# Patient Record
Sex: Female | Born: 1945 | Race: Black or African American | Hispanic: No
Health system: Southern US, Community
[De-identification: ages and names within clinical notes are randomized; demographics above are authoritative.]

---

## 2015-07-15 ENCOUNTER — Inpatient Hospital Stay
Admission: AD | Admit: 2015-07-15 | Discharge: 2015-08-07 | Disposition: E | Payer: Self-pay | Source: Ambulatory Visit | Attending: Internal Medicine | Admitting: Internal Medicine

## 2015-07-15 DIAGNOSIS — Z4659 Encounter for fitting and adjustment of other gastrointestinal appliance and device: Secondary | ICD-10-CM

## 2015-07-15 DIAGNOSIS — I469 Cardiac arrest, cause unspecified: Secondary | ICD-10-CM

## 2015-07-15 DIAGNOSIS — J9690 Respiratory failure, unspecified, unspecified whether with hypoxia or hypercapnia: Secondary | ICD-10-CM

## 2015-07-15 LAB — BLOOD GAS, ARTERIAL
ACID-BASE DEFICIT: 5.9 mmol/L — AB (ref 0.0–2.0)
Bicarbonate: 18.7 mEq/L — ABNORMAL LOW (ref 20.0–24.0)
FIO2: 0.3
MECHVT: 450 mL
O2 SAT: 95.4 %
PEEP/CPAP: 5 cmH2O
PH ART: 7.348 — AB (ref 7.350–7.450)
PO2 ART: 84.1 mmHg (ref 80.0–100.0)
Patient temperature: 98.6
RATE: 16 resp/min
TCO2: 19.8 mmol/L (ref 0–100)
pCO2 arterial: 35 mmHg (ref 35.0–45.0)

## 2015-07-16 ENCOUNTER — Other Ambulatory Visit (HOSPITAL_COMMUNITY): Payer: Self-pay

## 2015-07-16 DIAGNOSIS — J9621 Acute and chronic respiratory failure with hypoxia: Secondary | ICD-10-CM

## 2015-07-16 DIAGNOSIS — Z930 Tracheostomy status: Secondary | ICD-10-CM

## 2015-07-16 LAB — COMPREHENSIVE METABOLIC PANEL
ALBUMIN: 2.6 g/dL — AB (ref 3.5–5.0)
ALT: 34 U/L (ref 14–54)
AST: 66 U/L — AB (ref 15–41)
Alkaline Phosphatase: 51 U/L (ref 38–126)
Anion gap: 15 (ref 5–15)
BUN: 72 mg/dL — AB (ref 6–20)
CALCIUM: 8.2 mg/dL — AB (ref 8.9–10.3)
CO2: 16 mmol/L — ABNORMAL LOW (ref 22–32)
CREATININE: 3.99 mg/dL — AB (ref 0.44–1.00)
Chloride: 101 mmol/L (ref 101–111)
GFR calc Af Amer: 12 mL/min — ABNORMAL LOW (ref >60)
GFR, EST NON AFRICAN AMERICAN: 11 mL/min — AB (ref >60)
GLUCOSE: 100 mg/dL — AB (ref 65–99)
Potassium: 4.1 mmol/L (ref 3.5–5.1)
Sodium: 132 mmol/L — ABNORMAL LOW (ref 135–145)
TOTAL PROTEIN: 6.7 g/dL (ref 6.5–8.1)
Total Bilirubin: 2.1 mg/dL — ABNORMAL HIGH (ref 0.3–1.2)

## 2015-07-16 LAB — CBC
HCT: 27.9 % — ABNORMAL LOW (ref 36.0–46.0)
Hemoglobin: 8.2 g/dL — ABNORMAL LOW (ref 12.0–15.0)
MCH: 23.5 pg — ABNORMAL LOW (ref 26.0–34.0)
MCHC: 29.4 g/dL — ABNORMAL LOW (ref 30.0–36.0)
MCV: 79.9 fL (ref 78.0–100.0)
PLATELETS: 143 10*3/uL — AB (ref 150–400)
RBC: 3.49 MIL/uL — AB (ref 3.87–5.11)
RDW: 27.9 % — ABNORMAL HIGH (ref 11.5–15.5)
WBC: 4.4 10*3/uL (ref 4.0–10.5)

## 2015-07-16 LAB — TSH: TSH: 1.409 u[IU]/mL (ref 0.350–4.500)

## 2015-07-16 NOTE — Consult Note (Signed)
   Name: Sheila Montgomery MRN: [id number] DOB: 1945/12/16    ADMISSION DATE:  07/19/2015 CONSULTATION DATE:  07/16/2015  REFERRING MD :  Sharyon MedicusHijazi  CHIEF COMPLAINT:  Ventilator weaning  HISTORY OF PRESENT ILLNESS:  70 year old woman with CVA and ischemic cardiomyopathy, nursing home resident was admitted to an outside hospital with altered mental status attributed to hepatic encephalopathy. Hospital course was complicated by acute hypercarbic respiratory failure requiring prolonged mechanical ventilation and tracheostomy, C. difficile colitis and VRE UTI. She required transient dialysis. On transfer she is being treated with Zyvox and being diuresed with Bumex. She is on lactulose and cephalexin for encephalopathy  SIGNIFICANT EVENTS    STUDIES:  Chest x-ray 3/10 alveolar interstitial edema     PAST MEDICAL HISTORY : CVA and ischemic cardiomyopathy,  Prior to Admission medications   Not on File   Allergies not on file  FAMILY HISTORY:  No family history of CAD   SOCIAL HISTORY: Nursing home resident    REVIEW OF SYSTEMS:  Unable to obtain, since she is unresponsive on mechanical ventilation   SUBJECTIVE:   VITAL SIGNS:    PHYSICAL EXAMINATION: Gen. Chronically ill-appearing, in no distress, normal affect ENT - no lesions, no post nasal drip Neck: No JVD, no thyromegaly, no carotid bruits, tracheostomy Lungs: no use of accessory muscles, no dullness to percussion, decreased without rales or rhonchi  Cardiovascular: Rhythm regular, heart sounds  normal, no murmurs, no peripheral edema Abdomen: soft and non-tender, no hepatosplenomegaly, BS normal. Musculoskeletal: No deformities, no cyanosis or clubbing Neuro:  Awake, eyes open, but does not follow commands, does maintain eye contact, moves all 4 extremities Skin:  Warm, no lesions/ rash    Recent Labs Lab 07/16/15 0600  NA 132*  K 4.1  CL 101  CO2 16*  BUN 72*  CREATININE 3.99*  GLUCOSE 100*    Recent  Labs Lab 07/16/15 0600  HGB 8.2*  HCT 27.9*  WBC 4.4  PLT 143*   Dg Chest Port 1 View  07/16/2015  CLINICAL DATA:  Respiratory failure, ischemic cardiomyopathy EXAM: PORTABLE CHEST 1 VIEW COMPARISON:  None in PACs FINDINGS: The lungs are adequately inflated. The cardiac silhouette is enlarged. The pulmonary vascularity is engorged. The left hemidiaphragm is obscured. The tracheostomy appliance tip projects at the level of the clavicular heads. The right internal jugular venous catheter tip projects over the midportion of the SVC. IMPRESSION: CHF with pulmonary interstitial and early alveolar edema. Probable small left pleural effusion. Electronically Signed   By: David  SwazilandJordan M.D.   On: 07/16/2015 07:33    ASSESSMENT / PLAN:  Acute on chronic respiratory failure/tracheostomy  -We'll start weaning on pressure support per facility protocol  Acute encephalopathy- continue lactulose and xifaxan  AKI on CKD- bumex being tried VRE UTI / C diff colitis- per select MD  Discussed on multidisciplinary rounds with select team   Oretha MilchALVA,RAKESH V. MD  07/16/2015, 4:39 PM

## 2015-07-17 ENCOUNTER — Other Ambulatory Visit (HOSPITAL_COMMUNITY): Payer: Self-pay

## 2015-07-17 LAB — CBC
HEMATOCRIT: 33.3 % — AB (ref 36.0–46.0)
HEMOGLOBIN: 9 g/dL — AB (ref 12.0–15.0)
MCH: 24.2 pg — AB (ref 26.0–34.0)
MCHC: 27 g/dL — AB (ref 30.0–36.0)
MCV: 89.5 fL (ref 78.0–100.0)
Platelets: 144 10*3/uL — ABNORMAL LOW (ref 150–400)
RBC: 3.72 MIL/uL — AB (ref 3.87–5.11)
RDW: 30.6 % — ABNORMAL HIGH (ref 11.5–15.5)
WBC: 15.7 10*3/uL — ABNORMAL HIGH (ref 4.0–10.5)

## 2015-07-17 LAB — BASIC METABOLIC PANEL
ANION GAP: 29 — AB (ref 5–15)
BUN: 87 mg/dL — AB (ref 6–20)
BUN: 88 mg/dL — ABNORMAL HIGH (ref 6–20)
CALCIUM: 9.1 mg/dL (ref 8.9–10.3)
CALCIUM: 9.3 mg/dL (ref 8.9–10.3)
CO2: 7 mmol/L — ABNORMAL LOW (ref 22–32)
CREATININE: 4.92 mg/dL — AB (ref 0.44–1.00)
CREATININE: 5.07 mg/dL — AB (ref 0.44–1.00)
Chloride: 93 mmol/L — ABNORMAL LOW (ref 101–111)
Chloride: 93 mmol/L — ABNORMAL LOW (ref 101–111)
GFR calc Af Amer: 10 mL/min — ABNORMAL LOW (ref >60)
GFR calc Af Amer: 9 mL/min — ABNORMAL LOW (ref >60)
GFR calc non Af Amer: 8 mL/min — ABNORMAL LOW (ref >60)
GFR, EST NON AFRICAN AMERICAN: 8 mL/min — AB (ref >60)
GLUCOSE: 71 mg/dL (ref 65–99)
GLUCOSE: 57 mg/dL — AB (ref 65–99)
Potassium: 5.8 mmol/L — ABNORMAL HIGH (ref 3.5–5.1)
Potassium: 6 mmol/L — ABNORMAL HIGH (ref 3.5–5.1)
Sodium: 129 mmol/L — ABNORMAL LOW (ref 135–145)
Sodium: 130 mmol/L — ABNORMAL LOW (ref 135–145)

## 2015-07-17 LAB — TROPONIN I
TROPONIN I: 11.51 ng/mL — AB (ref <0.031)
Troponin I: 10.9 ng/mL (ref <0.031)

## 2015-07-18 MED FILL — Medication: Qty: 1 | Status: AC

## 2015-08-07 NOTE — Code Documentation (Signed)
  Patient Name: Sheila Montgomery   MRN: [id number]   Date of Birth/ Sex: [birth year] , female      Admission Date: 07/28/2015  Attending Provider: Carron CurieAli Hijazi, MD  Primary Diagnosis: <principal problem not specified>   Indication: Pt was in her stable state during this hospitalization on vent, until she coded approximately 1:00pm today 07/20/2015. She underwent approximately 30 minutes resuscitation before ROSC was achieved. She then lost pulse approximately 6:30pm and CPR was initiated. ACLS protocol was underway when code team arrived to this code Blue. ROSC was achieved within approximately 10 minutes and patient stabilized with adequate pulse pressures. However approximately 8 minutes later she became pulseless again and CPR was resumed. Critical care physician Dr. Craige CottaSood and family were contacted and discussed plan at bedside. Patient was changed to DNR status during this code process after discussion with her sister at bedside. She once again achieved ROSC and decision was made accordingly to continue ventilator support.  Technical Description:  - CPR performance duration:  30 minutes  - Was defibrillation or cardioversion used? No   - Was external pacer placed? No  - Was patient intubated pre/post CPR? Pre CPR   Medications Administered: Y = Yes; Blank = No Amiodarone    Atropine    Calcium    Epinephrine  Y  Lidocaine    Magnesium    Norepinephrine    Phenylephrine    Sodium bicarbonate  Y  Vasopressin     Post CPR evaluation:  - Final Status - Was patient successfully resuscitated ? Yes - What is current rhythm? Sinus rhythm, with PVCs and some wide QRS complexes - What is current hemodynamic status? Hypotensive, perfusing  Miscellaneous Information:  - Labs sent, including:   - Primary team notified?  Yes  - Family Notified? Yes  - Additional notes/ transfer status: Patient now DNR status, not planning transfer to ICU     Fuller Planhristopher W Airyana Sprunger, MD  07/20/2015, 7:26 PM

## 2015-08-07 NOTE — Progress Notes (Signed)
   07/27/2015 1943  Clinical Encounter Type  Visited With Family;Health care provider  Visit Type Initial;Code  Referral From Nurse  Spiritual Encounters  Spiritual Needs Emotional;Grief support  Stress Factors  Family Stress Factors Loss;Health changes   Chaplain responded to a code blue in [facility] development office[facility] Specialty. Chaplain offered some support. Chaplain services available as needed.   Alda Ponderdam M Cira Deyoe, Chaplain 07/25/2015 7:45 PM

## 2015-08-07 DEATH — deceased

## 2017-08-09 IMAGING — DX DG CHEST 1V PORT
1 series · 1 of 1 positions shown · non-contrast
Comparison: None in PACs

CLINICAL DATA: Respiratory failure, ischemic cardiomyopathy

EXAM:
PORTABLE CHEST 1 VIEW

[chest ap]
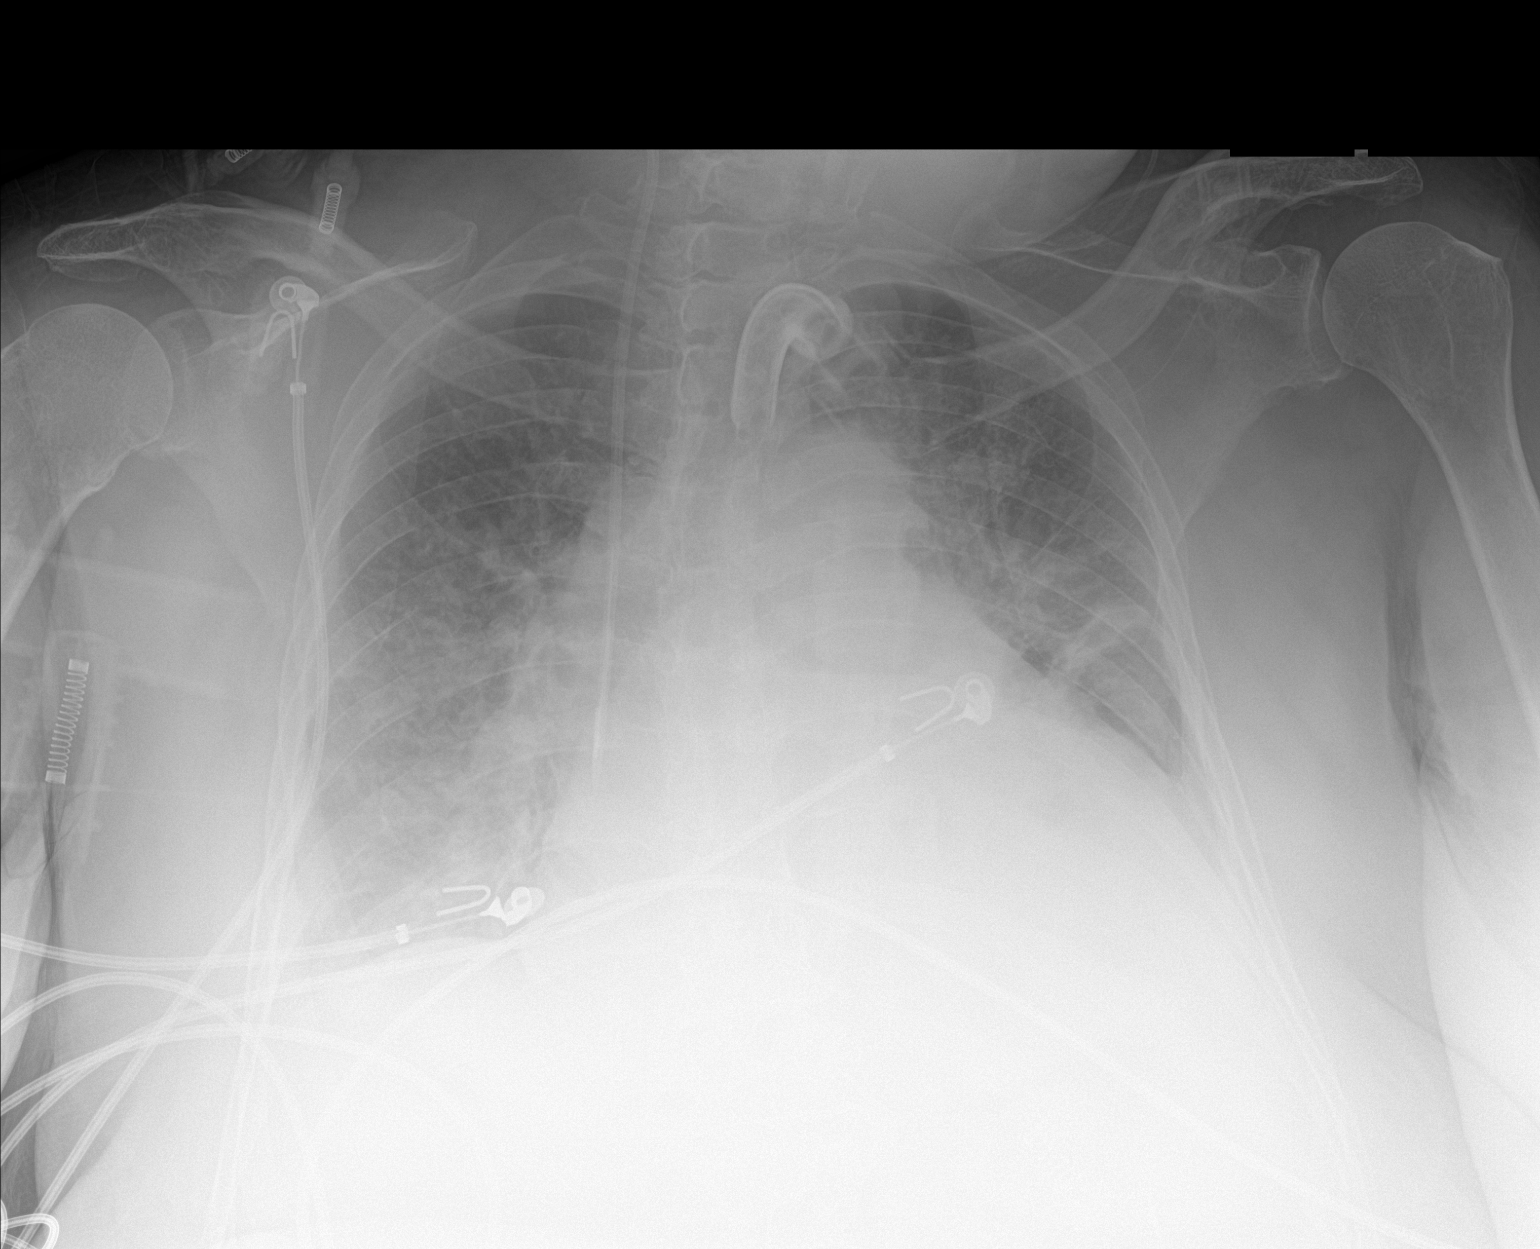

[1 of 1 positions shown; findings below may reference images not displayed]

FINDINGS: The lungs are adequately inflated. The cardiac silhouette is
enlarged. The pulmonary vascularity is engorged. The left
hemidiaphragm is obscured. The tracheostomy appliance tip projects
at the level of the clavicular heads. The right internal jugular
venous catheter tip projects over the midportion of the SVC.
IMPRESSION: CHF with pulmonary interstitial and early alveolar edema. Probable
small left pleural effusion.
